# Patient Record
Sex: Male | Born: 1990 | Race: White | Hispanic: No | Marital: Single | Smoking: Never smoker
Health system: Southern US, Community
[De-identification: ages and names within clinical notes are randomized; demographics above are authoritative.]

---

## 2015-10-24 ENCOUNTER — Ambulatory Visit (INDEPENDENT_AMBULATORY_CARE_PROVIDER_SITE_OTHER): Payer: BLUE CROSS/BLUE SHIELD | Admitting: Sports Medicine

## 2015-10-24 ENCOUNTER — Encounter: Payer: Self-pay | Admitting: Sports Medicine

## 2015-10-24 ENCOUNTER — Ambulatory Visit
Admission: RE | Admit: 2015-10-24 | Discharge: 2015-10-24 | Disposition: A | Discharge status: Home / Self Care | Payer: PRIVATE HEALTH INSURANCE | Source: Ambulatory Visit | Attending: Sports Medicine | Admitting: Sports Medicine

## 2015-10-24 VITALS — BP 119/83 | Ht 72.0 in | Wt 180.0 lb

## 2015-10-24 DIAGNOSIS — G8929 Other chronic pain: Secondary | ICD-10-CM

## 2015-10-24 DIAGNOSIS — M545 Low back pain: Secondary | ICD-10-CM | POA: Diagnosis not present

## 2015-10-24 NOTE — Progress Notes (Addendum)
   Subjective:    Patient ID: Jesse Maxwell, male    DOB: 11/30/1990, 25 y.o.   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 At the time he was just getting some spasm with activity but over the past couple of years his symptoms have become more frequent. He has been able to continue to stay active but is having daily pain. He describes it as a sharp pain that does not radiate. No numbness or tingling. He has had 6 weeks of treatment in [facility] [employer] [employer] but despite this his symptoms persist. He has not had any imaging. He takes occasional over-the-counter anti-inflammatories and has done so for the past couple of years. No prior low back surgery. No groin pain. No nighttime pain. No fever or weight loss.  Past medical history reviewed Medications reviewed Allergies reviewed    Review of Systems    as above Objective:   Physical Exam  Well-developed, fit appearing. No acute distress. Awake alert and oriented 3.  Lumbar spine: Full lumbar range of motion. Mild spasm along the right paraspinal muscles are. No tenderness over the SI joint. No tenderness to palpation or percussion along the lumbar midline.  Neurological exam shows a negative straight leg raise. Strength is 5/5 both lower extremities with reflexes equal at the Achilles and patellar tendons bilaterally. No atrophy. Sensation is intact to light touch grossly.      Assessment & Plan:   Chronic right-sided low back pain  I'm going to start with getting some x-rays of his lumbar spine. If unremarkable, we will proceed with an MRI. Given the chronicity of his symptoms as well as his failure to improve over the past 6 weeks with treatment and therapy in the training room, I'm a little concerned that he may  have an atypical lumbar disc protrusion. I will follow-up with him in the training room with those results once available at which point we'll delineate further treatment. In the meantime, I think he may continue with activity using pain as his guide.  Addendum: X-rays reviewed. Minimal L5-S1 disc space narrowing seen. Possible Schmorl's nodes from L1-L2 through L4-L5. No obvious pars defect. Proceed with MRI as scheduled.

## 2015-10-28 ENCOUNTER — Other Ambulatory Visit: Payer: PRIVATE HEALTH INSURANCE

## 2018-01-15 IMAGING — CR DG LUMBAR SPINE 2-3V
3 series · 3 of 3 positions shown · non-contrast
Comparison: None.

CLINICAL DATA: 25-year-old male with right-sided lower back pain
for 3 - 4 years. Plays baseball. No known injury. Initial encounter.

EXAM:
LUMBAR SPINE - 2-3 VIEW

[view not recorded (1 of 3)]
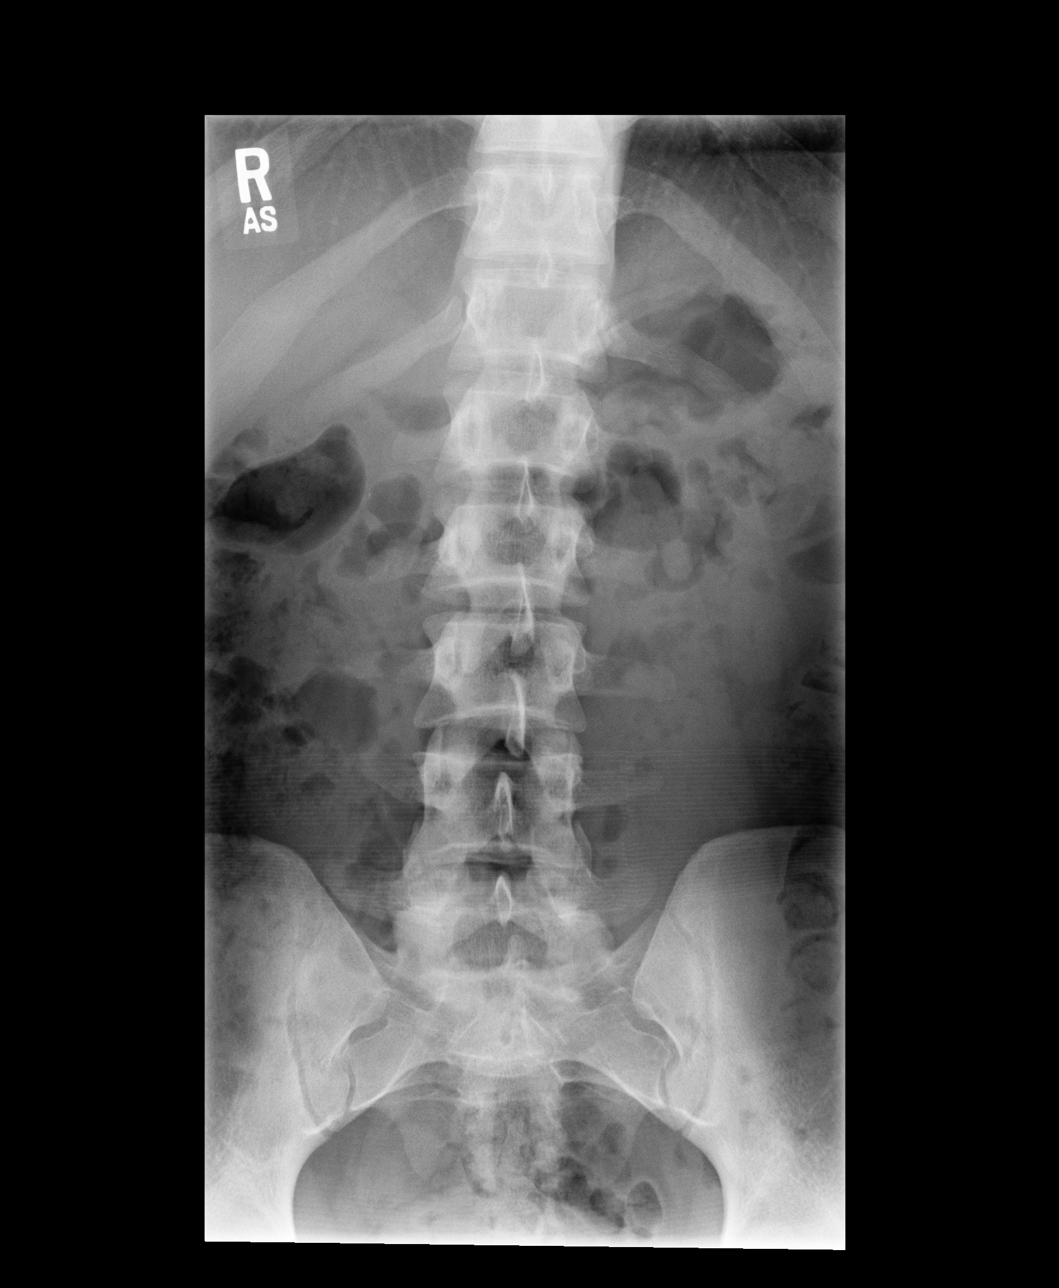

[view not recorded (2 of 3)]
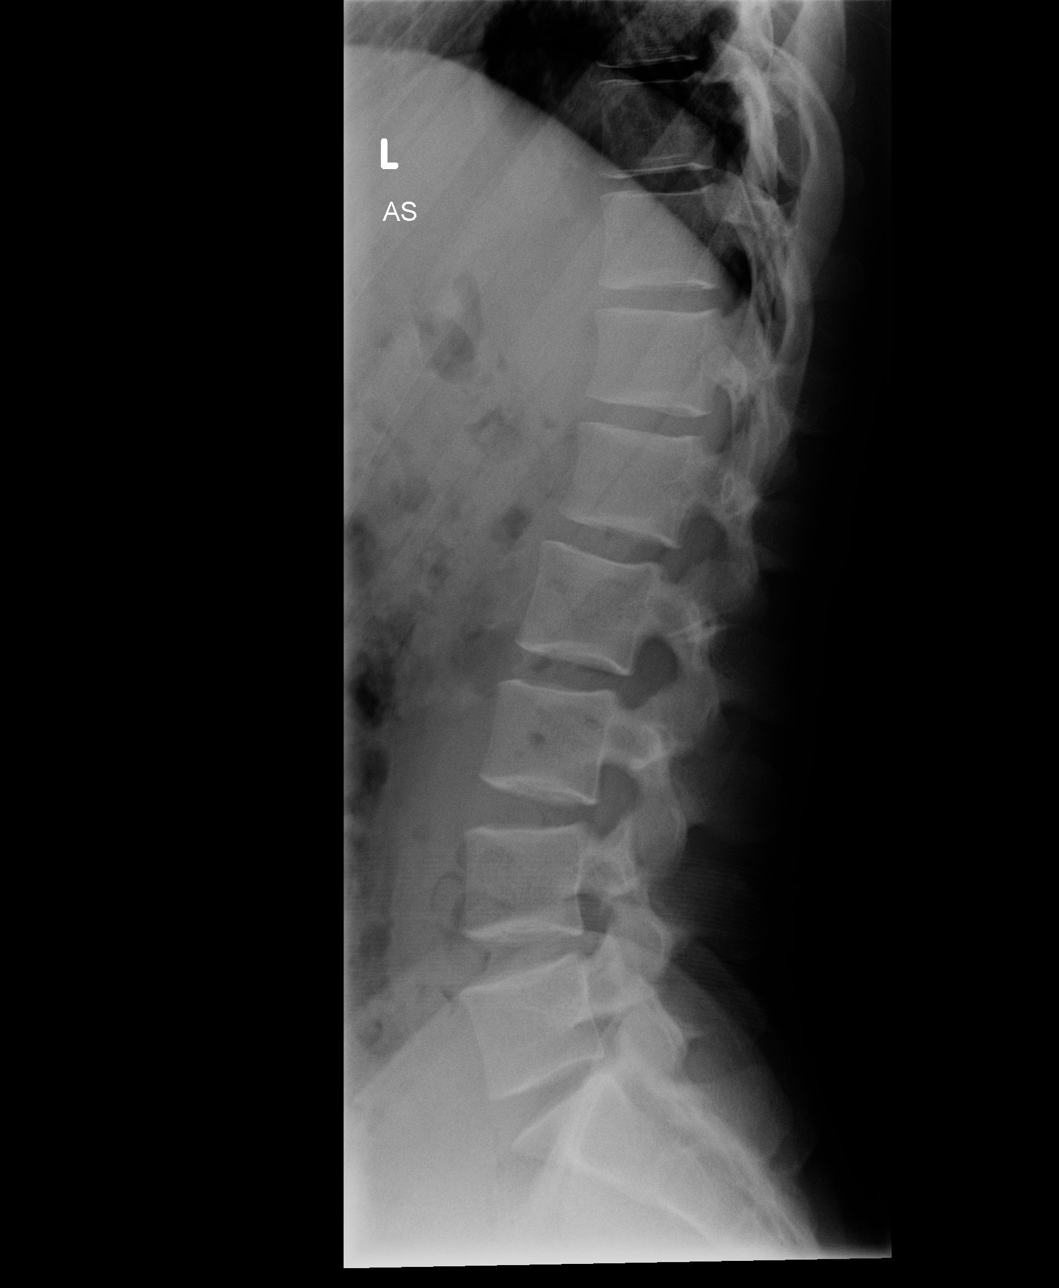

[view not recorded (3 of 3)]
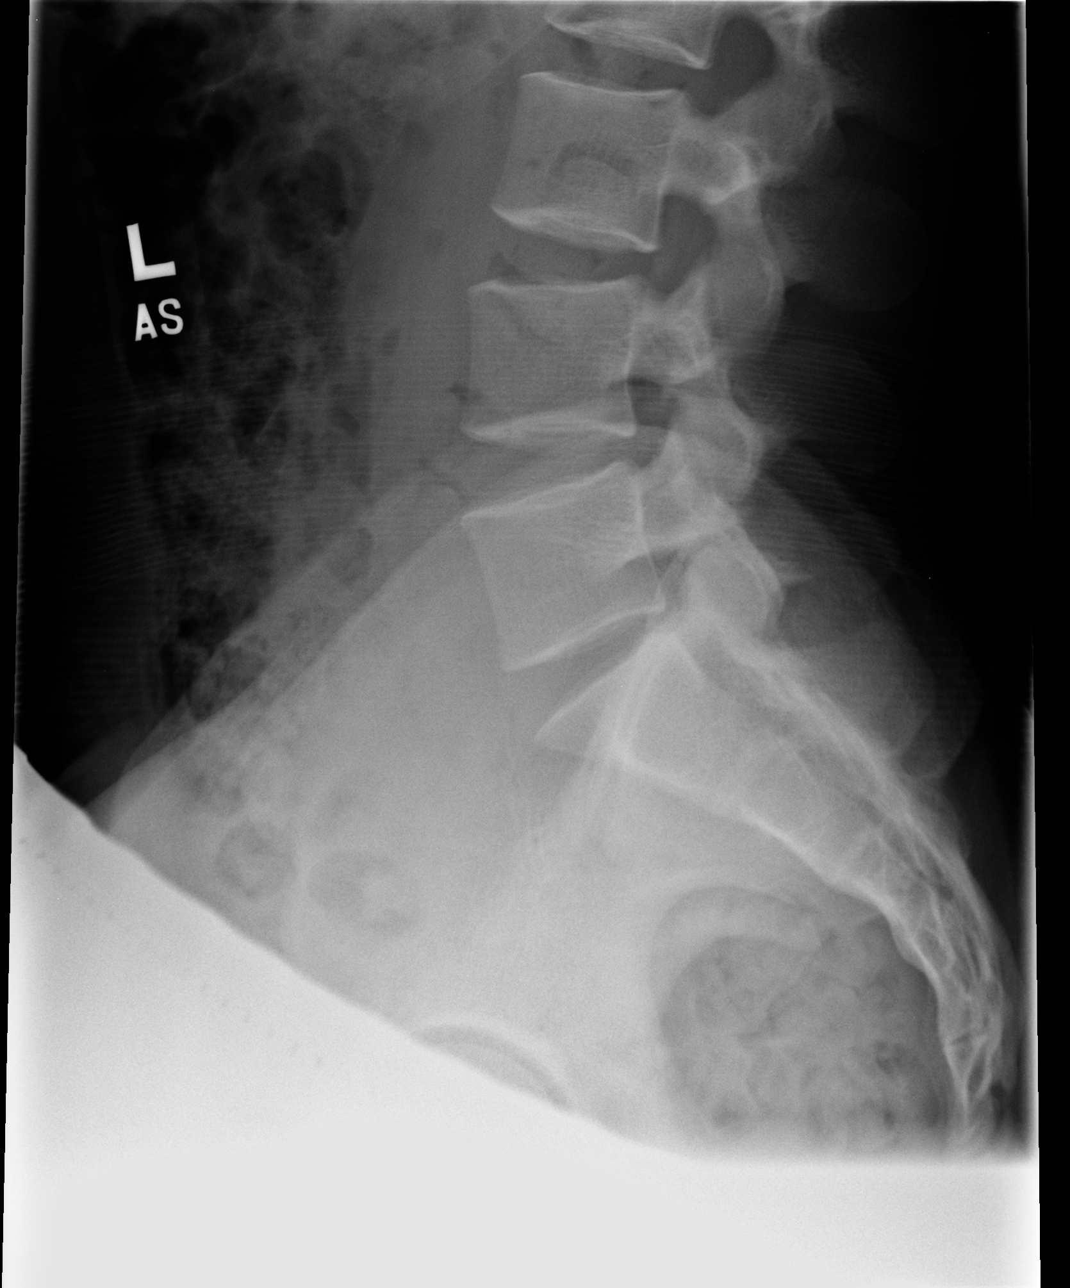

[3 of 3 positions shown; findings below may reference images not displayed]

FINDINGS: Minimal curvature lumbar spine convex to the right.

Minimal L5-S1 disc space narrowing.

There may be minimal Schmorl's node deformities L1-2 through L4-5.

No obvious pars defect although oblique imaging was not performed.
IMPRESSION: Minimal L5-S1 disc space narrowing.

Minimal curvature lumbar spine convex right.
